# Patient Record
Sex: Male | Born: 1980 | Race: Black or African American | Hispanic: No | Smoking: Never smoker
Health system: Southern US, Community
[De-identification: ages and names within clinical notes are randomized; demographics above are authoritative.]

## PROBLEM LIST (undated history)

## (undated) DIAGNOSIS — N289 Disorder of kidney and ureter, unspecified: Secondary | ICD-10-CM

## (undated) DIAGNOSIS — I10 Essential (primary) hypertension: Secondary | ICD-10-CM

---

## 2021-05-09 ENCOUNTER — Other Ambulatory Visit: Payer: Self-pay

## 2021-05-09 ENCOUNTER — Encounter: Payer: Self-pay | Admitting: Licensed Clinical Social Worker

## 2021-05-09 ENCOUNTER — Ambulatory Visit
Admission: EM | Admit: 2021-05-09 | Discharge: 2021-05-09 | Disposition: A | Discharge status: Home / Self Care | Payer: No Typology Code available for payment source

## 2021-05-09 DIAGNOSIS — J069 Acute upper respiratory infection, unspecified: Secondary | ICD-10-CM | POA: Diagnosis not present

## 2021-05-09 HISTORY — DX: Essential (primary) hypertension: I10

## 2021-05-09 HISTORY — DX: Disorder of kidney and ureter, unspecified: N28.9

## 2021-05-09 MED ORDER — PROMETHAZINE-DM 6.25-15 MG/5ML PO SYRP: 5.0000 mL | ORAL_SOLUTION | Freq: Four times a day (QID) | ORAL | 0 refills | Status: DC | PRN

## 2021-05-09 MED ORDER — AMOXICILLIN-POT CLAVULANATE 875-125 MG PO TABS
1.0000 | ORAL_TABLET | Freq: Two times a day (BID) | ORAL | 0 refills | Status: AC
Start: 2021-05-09 — End: 2021-05-19

## 2021-05-09 MED ORDER — BENZONATATE 100 MG PO CAPS: 200.0000 mg | ORAL_CAPSULE | Freq: Three times a day (TID) | ORAL | 0 refills | Status: DC

## 2021-05-09 MED ORDER — IPRATROPIUM BROMIDE 0.06 % NA SOLN: 2.0000 | Freq: Four times a day (QID) | NASAL | 12 refills | Status: DC

## 2021-05-09 NOTE — ED Triage Notes (Signed)
Pt c/o sore throat, thick green mucus when coughing and blowing nose.

## 2021-05-09 NOTE — Discharge Instructions (Signed)
Take the Augmentin twice daily for 10 days for treatment of your URI.  Use the Atrovent nasal spray, 2 squirts in each nostril every 6 hours, as needed for runny nose and postnasal drip.  Use the Tessalon Perles every 8 hours during the day.  Take them with a small sip of water.  They may give you some numbness to the base of your tongue or a metallic taste in your mouth, this is normal.  Use OTC Tylenol and Ibuprofen as needed for fever or pain.   Use the Promethazine DM cough syrup at bedtime for cough and congestion.  It will make you drowsy so do not take it during the day.  Return for reevaluation or see your primary care provider for any new or worsening symptoms.

## 2021-05-09 NOTE — ED Provider Notes (Signed)
[facility]    CSN: [id number] Arrival date & time: 05/09/21  1409      History   Chief Complaint No chief complaint on file.   HPI Aaron Mcdowell is a 41 y.o. male.   HPI  41 year old male here for evaluation of respiratory complaints.  Patient reports that for the last week or more he has been experiencing ear fullness, nasal congestion with green nasal discharge, a severe sore throat, productive cough for green sputum, and fatigue.  He denies any fever, shortness breath or wheezing, or GI complaints.  His son and daughter have both had similar symptoms.  Past Medical History:  Diagnosis Date   Hypertension    Renal disorder     There are no problems to display for this patient.   History reviewed. No pertinent surgical history.     Home Medications    Prior to Admission medications   Medication Sig Start Date End Date Taking? Authorizing Provider  amoxicillin-clavulanate (AUGMENTIN) 875-125 MG tablet Take 1 tablet by mouth every 12 (twelve) hours for 10 days. 05/09/21 05/19/21 Yes Becky Augusta, NP  benzonatate (TESSALON) 100 MG capsule Take 2 capsules (200 mg total) by mouth every 8 (eight) hours. 05/09/21  Yes Becky Augusta, NP  hydrochlorothiazide (HYDRODIURIL) 25 MG tablet hydrochlorothiazide 25 mg tablet  TAKE 1 TABLET BY MOUTH EVERY DAY 04/13/21  Yes [provider]  ipratropium (ATROVENT) 0.06 % nasal spray Place 2 sprays into both nostrils 4 (four) times daily. 05/09/21  Yes Becky Augusta, NP  promethazine-dextromethorphan (PROMETHAZINE-DM) 6.25-15 MG/5ML syrup Take 5 mLs by mouth 4 (four) times daily as needed. 05/09/21  Yes Becky Augusta, NP  amLODipine (NORVASC) 5 MG tablet Take 5 mg by mouth daily. 04/13/21   [provider]  losartan (COZAAR) 50 MG tablet losartan 50 mg tablet    [provider]    Family History History reviewed. No pertinent family history.  Social History Social History   Tobacco Use   Smoking  status: Never   Smokeless tobacco: Never  Substance Use Topics   Alcohol use: Never   Drug use: Never     Allergies   Patient has no known allergies.   Review of Systems Review of Systems  Constitutional:  Positive for fatigue. Negative for activity change, appetite change and fever.  HENT:  Positive for congestion, ear pain, rhinorrhea, sinus pressure and sore throat.   Respiratory:  Positive for cough. Negative for shortness of breath and wheezing.   Gastrointestinal:  Negative for diarrhea, nausea and vomiting.  Skin:  Negative for rash.  Hematological: Negative.   Psychiatric/Behavioral: Negative.      Physical Exam Triage Vital Signs ED Triage Vitals  Enc Vitals Group     BP 05/09/21 1442 (!) 147/98     Pulse Rate 05/09/21 1442 89     Resp 05/09/21 1442 16     Temp 05/09/21 1442 98.9 F (37.2 C)     Temp Source 05/09/21 1442 Oral     SpO2 05/09/21 1442 98 %     Weight 05/09/21 1438 260 lb (117.9 kg)     Height 05/09/21 1438 5\' 6"  (1.676 m)     Head Circumference --      Peak Flow --      Pain Score 05/09/21 1438 8     Pain Loc --      Pain Edu? --      Excl. in GC? --    No data found.  Updated  Vital Signs BP (!) 147/98 (BP Location: Left Arm)    Pulse 89    Temp 98.9 F (37.2 C) (Oral)    Resp 16    Ht 5\' 6"  (1.676 m)    Wt 260 lb (117.9 kg)    SpO2 98%    BMI 41.97 kg/m   Visual Acuity Right Eye Distance:   Left Eye Distance:   Bilateral Distance:    Right Eye Near:   Left Eye Near:    Bilateral Near:     Physical Exam Vitals and nursing note reviewed.  Constitutional:      General: He is not in acute distress.    Appearance: Normal appearance. He is ill-appearing.  HENT:     Head: Normocephalic and atraumatic.     Right Ear: Tympanic membrane, ear canal and external ear normal. There is no impacted cerumen.     Left Ear: Tympanic membrane, ear canal and external ear normal. There is no impacted cerumen.     Nose: Congestion and rhinorrhea  present.     Mouth/Throat:     Mouth: Mucous membranes are moist.     Pharynx: Oropharynx is clear. Posterior oropharyngeal erythema present.  Cardiovascular:     Rate and Rhythm: Normal rate and regular rhythm.     Pulses: Normal pulses.     Heart sounds: Normal heart sounds. No murmur heard.   No friction rub. No gallop.  Pulmonary:     Effort: Pulmonary effort is normal.     Breath sounds: Normal breath sounds. No wheezing, rhonchi or rales.  Musculoskeletal:     Cervical back: Normal range of motion and neck supple.  Lymphadenopathy:     Cervical: No cervical adenopathy.  Skin:    General: Skin is warm and dry.     Capillary Refill: Capillary refill takes less than 2 seconds.     Findings: No erythema or rash.  Neurological:     General: No focal deficit present.     Mental Status: He is alert and oriented to person, place, and time.  Psychiatric:        Mood and Affect: Mood normal.        Behavior: Behavior normal.        Thought Content: Thought content normal.        Judgment: Judgment normal.     UC Treatments / Results  Labs (all labs ordered are listed, but only abnormal results are displayed) Labs Reviewed - No data to display  EKG   Radiology No results found.  Procedures Procedures (including critical care time)  Medications Ordered in UC Medications - No data to display  Initial Impression / Assessment and Plan / UC Course  I have reviewed the triage vital signs and the nursing notes.  Pertinent labs & imaging results that were available during my care of the patient were reviewed by me and considered in my medical decision making (see chart for details).  Patient is a very pleasant though ill-appearing 41 year old male here for evaluation of respiratory symptoms as outlined HPI above.  Patient's physical exam reveals protegrin tympanic membranes bilaterally with normal reflex and clear external auditory canals.  Nasal mucosa is erythematous and  edematous with purulent discharge in both nares.  Maxillary and frontal sinuses are nontender to percussion.  Oropharyngeal exam reveals posterior oropharyngeal erythema with yellow postnasal drip.  No cervical lymphadenopathy appreciated exam.  Cardiopulmonary exam reveals clear lung sounds in all fields.  Patient Damas consistent with an  upper respiratory infection.  Due to the protracted duration of symptoms I will do a trial of antibiotics with Augmentin twice daily for 10 days.  We will also give Atrovent nasal spray to up the nasal congestion, Tessalon Perles and Promethazine DM cough syrup to help with cough and congestion.  Tylenol and ibuprofen as needed for fever and body aches.  Return precautions reviewed with patient.   Final Clinical Impressions(s) / UC Diagnoses   Final diagnoses:  Acute upper respiratory infection     Discharge Instructions      Take the Augmentin twice daily for 10 days for treatment of your URI.  Use the Atrovent nasal spray, 2 squirts in each nostril every 6 hours, as needed for runny nose and postnasal drip.  Use the Tessalon Perles every 8 hours during the day.  Take them with a small sip of water.  They may give you some numbness to the base of your tongue or a metallic taste in your mouth, this is normal.  Use OTC Tylenol and Ibuprofen as needed for fever or pain.   Use the Promethazine DM cough syrup at bedtime for cough and congestion.  It will make you drowsy so do not take it during the day.  Return for reevaluation or see your primary care provider for any new or worsening symptoms.      ED Prescriptions     Medication Sig Dispense Auth. Provider   amoxicillin-clavulanate (AUGMENTIN) 875-125 MG tablet Take 1 tablet by mouth every 12 (twelve) hours for 10 days. 20 tablet Becky Augusta, NP   ipratropium (ATROVENT) 0.06 % nasal spray Place 2 sprays into both nostrils 4 (four) times daily. 15 mL Becky Augusta, NP   benzonatate (TESSALON) 100 MG  capsule Take 2 capsules (200 mg total) by mouth every 8 (eight) hours. 21 capsule Becky Augusta, NP   promethazine-dextromethorphan (PROMETHAZINE-DM) 6.25-15 MG/5ML syrup Take 5 mLs by mouth 4 (four) times daily as needed. 118 mL Becky Augusta, NP      PDMP not reviewed this encounter.   Becky Augusta, NP 05/09/21 1536

## 2021-07-02 ENCOUNTER — Ambulatory Visit
Admission: RE | Admit: 2021-07-02 | Discharge: 2021-07-02 | Disposition: A | Discharge status: Home / Self Care | Payer: No Typology Code available for payment source | Source: Ambulatory Visit | Attending: General Practice | Admitting: General Practice

## 2021-07-02 ENCOUNTER — Other Ambulatory Visit: Payer: Self-pay | Admitting: General Practice

## 2021-07-02 DIAGNOSIS — M545 Low back pain, unspecified: Secondary | ICD-10-CM | POA: Diagnosis present

## 2021-07-02 DIAGNOSIS — M62830 Muscle spasm of back: Secondary | ICD-10-CM | POA: Diagnosis present

## 2021-07-02 DIAGNOSIS — S3992XA Unspecified injury of lower back, initial encounter: Secondary | ICD-10-CM | POA: Insufficient documentation

## 2021-07-02 DIAGNOSIS — M9903 Segmental and somatic dysfunction of lumbar region: Secondary | ICD-10-CM | POA: Diagnosis present

## 2021-07-02 DIAGNOSIS — X501XXA Overexertion from prolonged static or awkward postures, initial encounter: Secondary | ICD-10-CM | POA: Diagnosis not present

## 2021-07-02 DIAGNOSIS — M47817 Spondylosis without myelopathy or radiculopathy, lumbosacral region: Secondary | ICD-10-CM | POA: Insufficient documentation

## 2021-11-27 ENCOUNTER — Encounter: Payer: Self-pay | Admitting: Urology

## 2021-11-27 ENCOUNTER — Ambulatory Visit (INDEPENDENT_AMBULATORY_CARE_PROVIDER_SITE_OTHER): Payer: No Typology Code available for payment source | Admitting: Urology

## 2021-11-27 ENCOUNTER — Other Ambulatory Visit: Admission: RE | Admit: 2021-11-27 | Payer: No Typology Code available for payment source | Source: Ambulatory Visit

## 2021-11-27 VITALS — BP 125/84 | HR 66 | Ht 71.0 in

## 2021-11-27 DIAGNOSIS — Z3009 Encounter for other general counseling and advice on contraception: Secondary | ICD-10-CM

## 2021-11-27 NOTE — Progress Notes (Signed)
   11/27/21 3:24 PM   Aaron Mcdowell May 02, 1981 [id number]  CC: Discuss vasectomy  HPI: 41 year old male with polycystic kidney disease and CKD who presents to discuss vasectomy.  He has some mild urinary frequency and urgency with medications, but otherwise denies urinary symptoms.  Denies any family history of prostate cancer.  He has children ages 39 and twins that are 39.  He and his partner do not desire any further biologic pregnancies   PMH: Past Medical History:  Diagnosis Date   Hypertension    Renal disorder     Social History:  reports that he has never smoked. He has never used smokeless tobacco. He reports that he does not drink alcohol and does not use drugs.  Physical Exam: BP 125/84 (BP Location: Left Arm, Patient Position: Sitting, Cuff Size: Large)   Pulse 66   Ht 5\' 11"  (1.803 m)   BMI 36.26 kg/m    Constitutional:  Alert and oriented, No acute distress. Cardiovascular: No clubbing, cyanosis, or edema. Respiratory: Normal respiratory effort, no increased work of breathing. GI: Abdomen is soft, nontender, nondistended, no abdominal masses GU: Circumcised phallus with patent meatus, testicles 20 cc and descended bilaterally without masses,  Assessment & Plan:   41 year old male with polycystic kidney disease and CKD interested in vasectomy for permanent sterilization.  We discussed the risks and benefits of vasectomy at length.  Vasectomy is intended to be a permanent form of contraception, and does not produce immediate sterility.  Following vasectomy another form of contraception is required until vas occlusion is confirmed by a post-vasectomy semen analysis obtained 2-3 months after the procedure.  Even after vas occlusion is confirmed, vasectomy is not 100% reliable in preventing pregnancy, and the failure rate is approximately 05/1998.  Repeat vasectomy is required in less than 1% of patients.  He should refrain from ejaculation for 1 week after vasectomy.   Options for fertility after vasectomy include vasectomy reversal, and sperm retrieval with in vitro fertilization or ICSI.  These options are not always successful and may be expensive.  Finally, there are other permanent and non-permanent alternatives to vasectomy available. There is no risk of erectile dysfunction, and the volume of semen will be similar to prior, as the majority of the ejaculate is from the prostate and seminal vesicles.   The procedure takes ~20 minutes.  We recommend patients take 5-10 mg of Valium 30 minutes prior, and he will need a driver post-procedure.  Local anesthetic is injected into the scrotal skin and a small segment of the vas deferens is removed, and the ends occluded. The complication rate is approximately 1-2%, and includes bleeding, infection, and development of chronic scrotal pain.  PLAN: Schedule vasectomy Defers Valium   06/1998, MD 11/27/2021  [facility] [address], [address] [place], [place] [phone]

## 2021-11-27 NOTE — Patient Instructions (Signed)

## 2022-01-01 ENCOUNTER — Encounter: Payer: No Typology Code available for payment source | Admitting: Urology

## 2022-01-01 ENCOUNTER — Encounter: Payer: Self-pay | Admitting: Urology

## 2022-01-01 ENCOUNTER — Ambulatory Visit (INDEPENDENT_AMBULATORY_CARE_PROVIDER_SITE_OTHER): Payer: No Typology Code available for payment source | Admitting: Urology

## 2022-01-01 VITALS — BP 126/72 | HR 71 | Ht 71.0 in | Wt 225.0 lb

## 2022-01-01 DIAGNOSIS — Z9852 Vasectomy status: Secondary | ICD-10-CM

## 2022-01-01 DIAGNOSIS — Z302 Encounter for sterilization: Secondary | ICD-10-CM

## 2022-01-01 NOTE — Progress Notes (Signed)
VASECTOMY PROCEDURE NOTE:  The patient was taken to the minor procedure room and placed in the supine position. His genitals were prepped and draped in the usual sterile fashion. The right vas deferens was brought up to the skin of the right upper scrotum. The skin overlying it was anesthetized with 1% lidocaine without epinephrine, anesthetic was also injected alongside the vas deferens in the direction of the inguinal canal. The no scalpel vasectomy instrument was used to make a small perforation in the scrotal skin. The vasectomy clamp was used to grasp the vas deferens. It was carefully dissected free from surrounding structures. A 1cm segment of the vas was removed, and the cut ends of the mucosa were cauterized.  No significant bleeding was noted. The vas deferens was returned to the scrotum. The skin incision was closed with a simple interrupted stitch of 4-0 chromic.  Attention was then turned to the left side. The left vasectomy was performed in the same exact fashion. Sterile dressings were placed over each incision. The patient tolerated the procedure well.  IMPRESSION/DIAGNOSIS: The patient is a 41 year old gentleman who underwent a vasectomy today. Post-procedure instructions were reviewed. I stressed the importance of continuing to use birth control until he provides a semen specimen more than 2 months from now that demonstrates azoospermia.  We discussed return precautions including fever over 101, significant bleeding or hematoma, or uncontrolled pain. I also stressed the importance of avoiding strenuous activity for one week, no sexual activity or ejaculations for 5 days, intermittent icing over the next 48 hours, and scrotal support.   PLAN: The patient will be advised of his semen analysis results when available.  Legrand Rams, MD 01/01/2022

## 2022-01-01 NOTE — Patient Instructions (Signed)

## 2022-04-04 ENCOUNTER — Other Ambulatory Visit: Payer: No Typology Code available for payment source

## 2022-04-05 ENCOUNTER — Other Ambulatory Visit: Payer: No Typology Code available for payment source

## 2022-04-08 ENCOUNTER — Other Ambulatory Visit: Payer: No Typology Code available for payment source

## 2022-04-08 DIAGNOSIS — Z9852 Vasectomy status: Secondary | ICD-10-CM

## 2022-04-10 ENCOUNTER — Telehealth: Payer: Self-pay

## 2022-04-10 LAB — POST-VAS SPERM EVALUATION,QUAL: Volume: 2.7 mL

## 2022-04-10 NOTE — Telephone Encounter (Signed)
Called pt informed him that he needs a repeat semen analysis as sperm are still present per his lab results. Patient states that he declines repeat semen analysis and would be "happy if God gave me a son". The patient states that he and his wife have been married for 82yrs and never conceived naturally. He states that he and his wife only conceived via IVF. Strongly advised pt that the only way to ensure his vasectomy was effective is to have a negative semen analysis. Pt voiced understanding and states that he does not wish to proceed.

## 2022-04-10 NOTE — Telephone Encounter (Signed)
-----   Message from Sondra Come, MD sent at 04/10/2022  8:18 AM EST ----- Sperm still present, encourage ejaculations to clear residual sperm, repeat more sensitive semen analysis in 6 weeks  Legrand Rams, MD 04/10/2022

## 2023-09-04 IMAGING — CR DG LUMBAR SPINE COMPLETE 4+V
1 series · 5 of 5 positions shown · non-contrast
Comparison: None.

CLINICAL DATA: Somatic dysfunction of lumbar region.

Low back pain, unspecified. Back muscle spasm. Patient reports
injury few weeks ago picking up daughter.
EXAM:
LUMBAR SPINE - COMPLETE 4+ VIEW

[Series 1: dg lumbar spine complete 4 +v · 0.14mm/px · 5 of 5 slices shown]
[im 1/5]
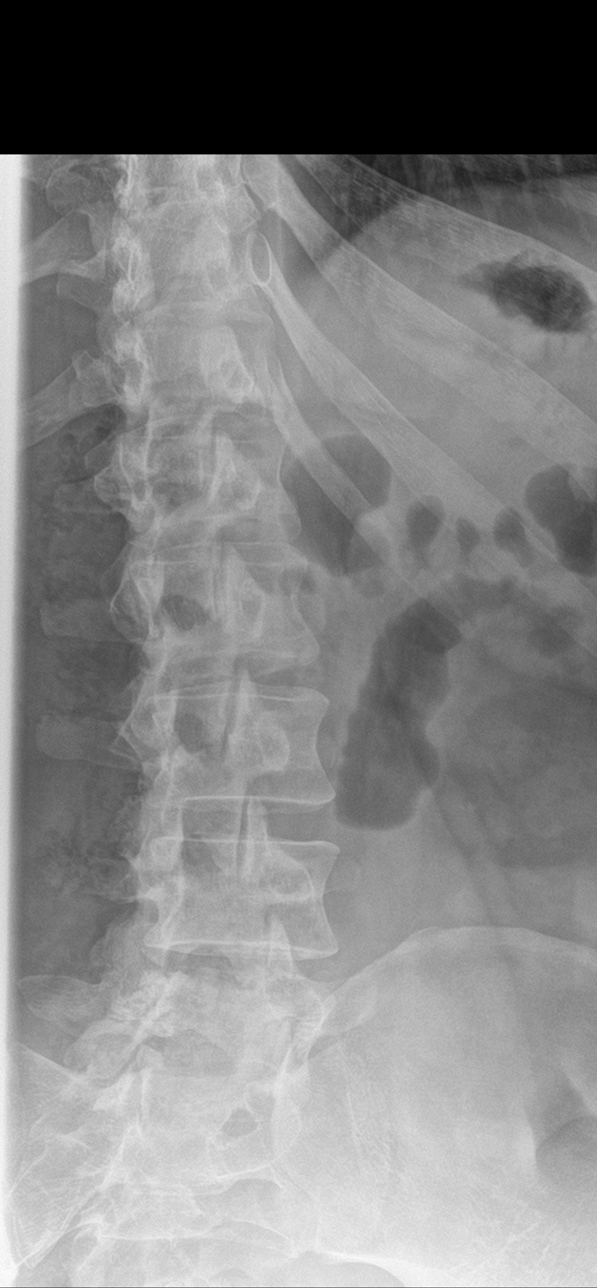
[im 2/5]
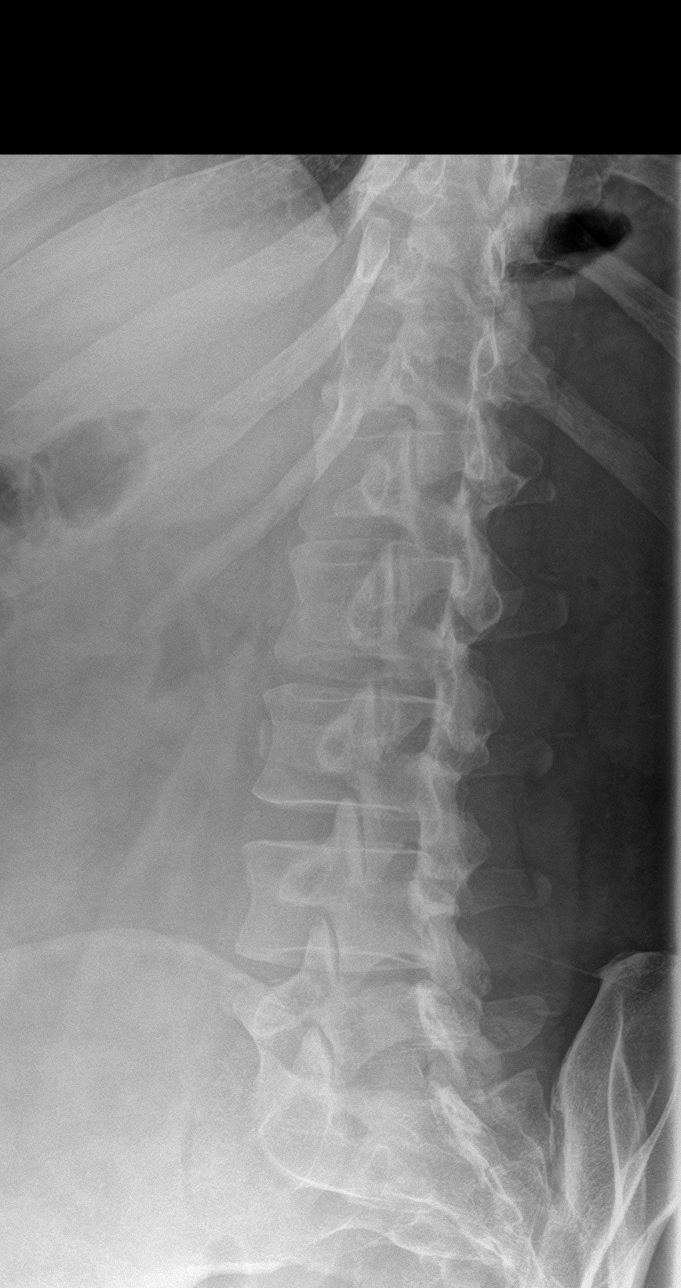
[im 3/5]
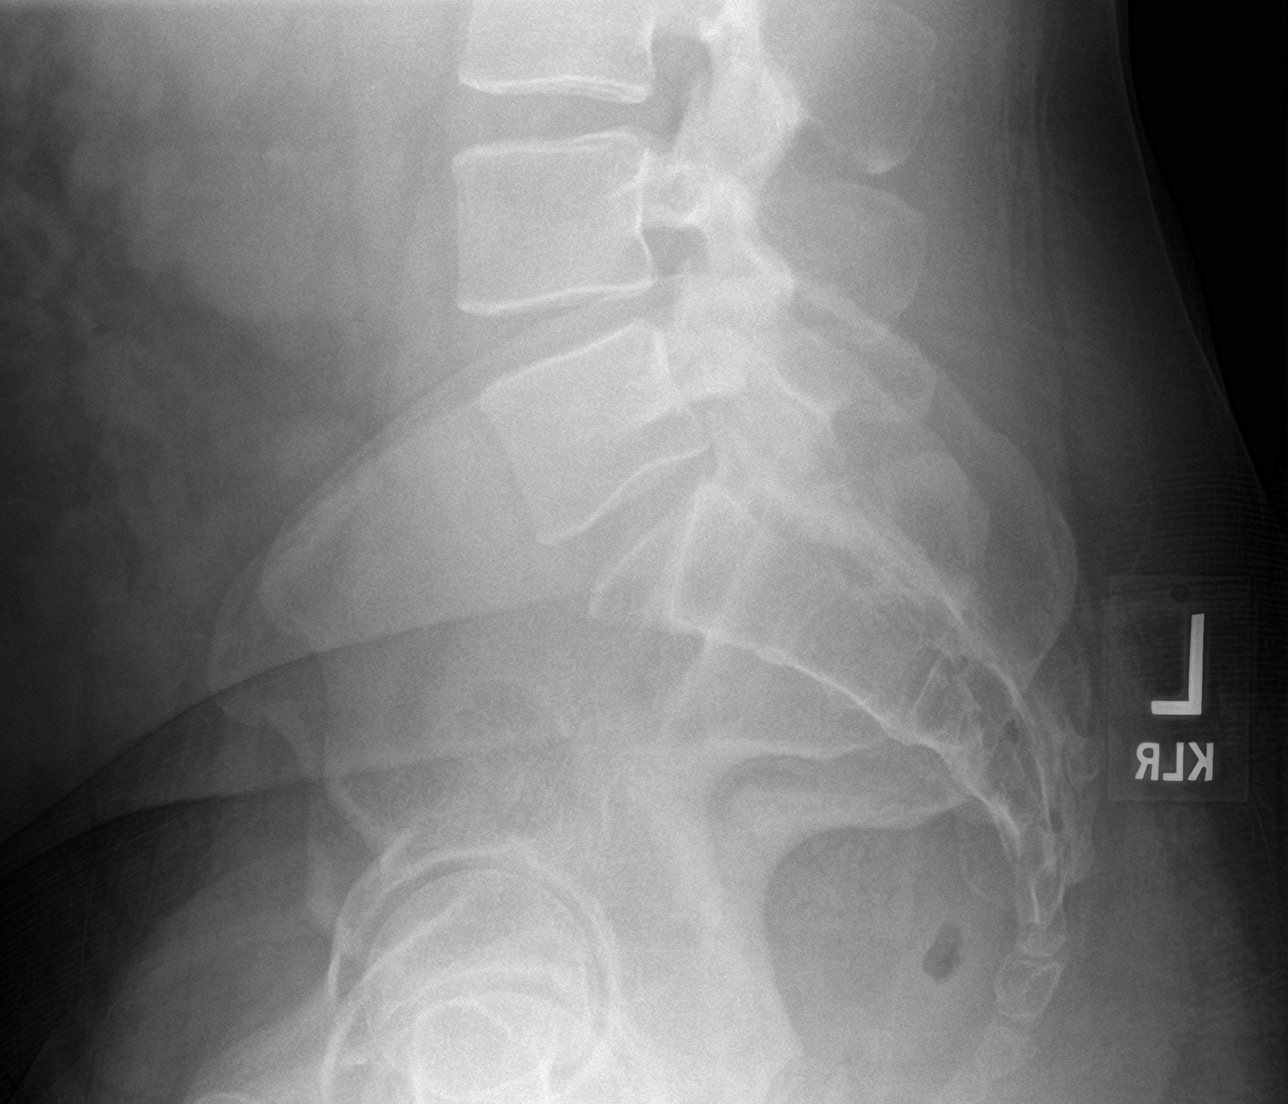
[im 4/5]
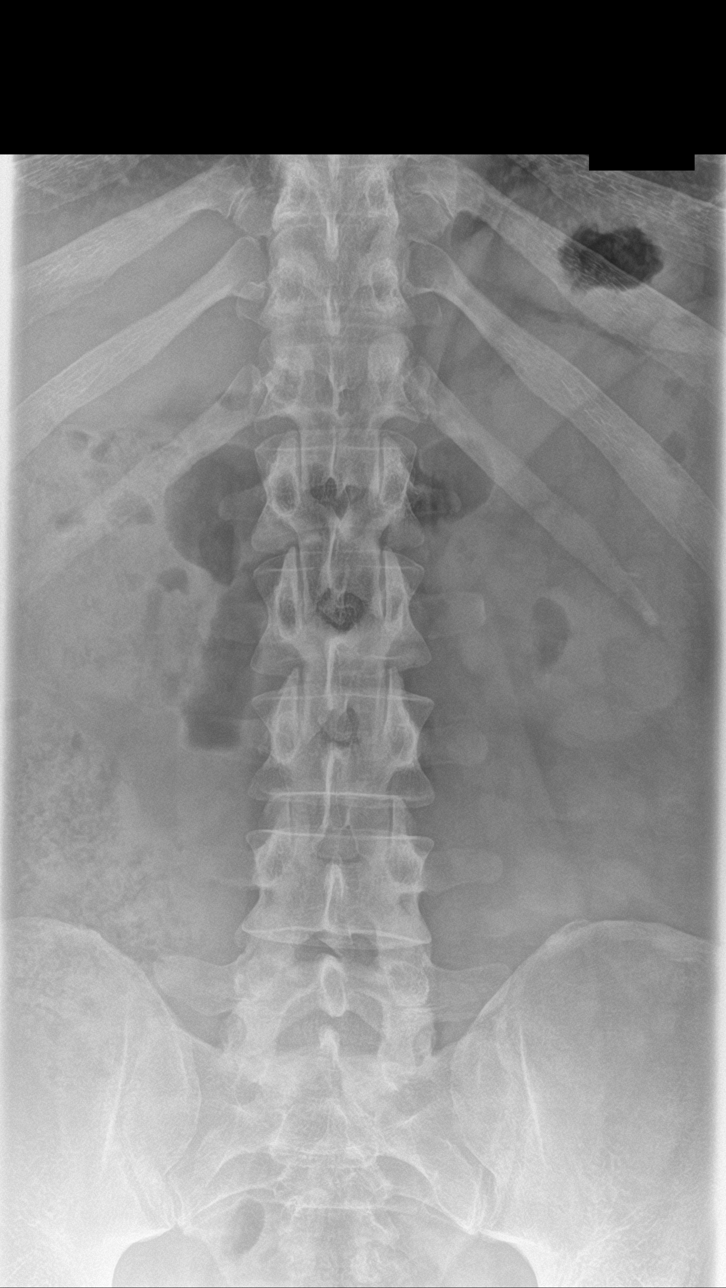
[im 5/5]
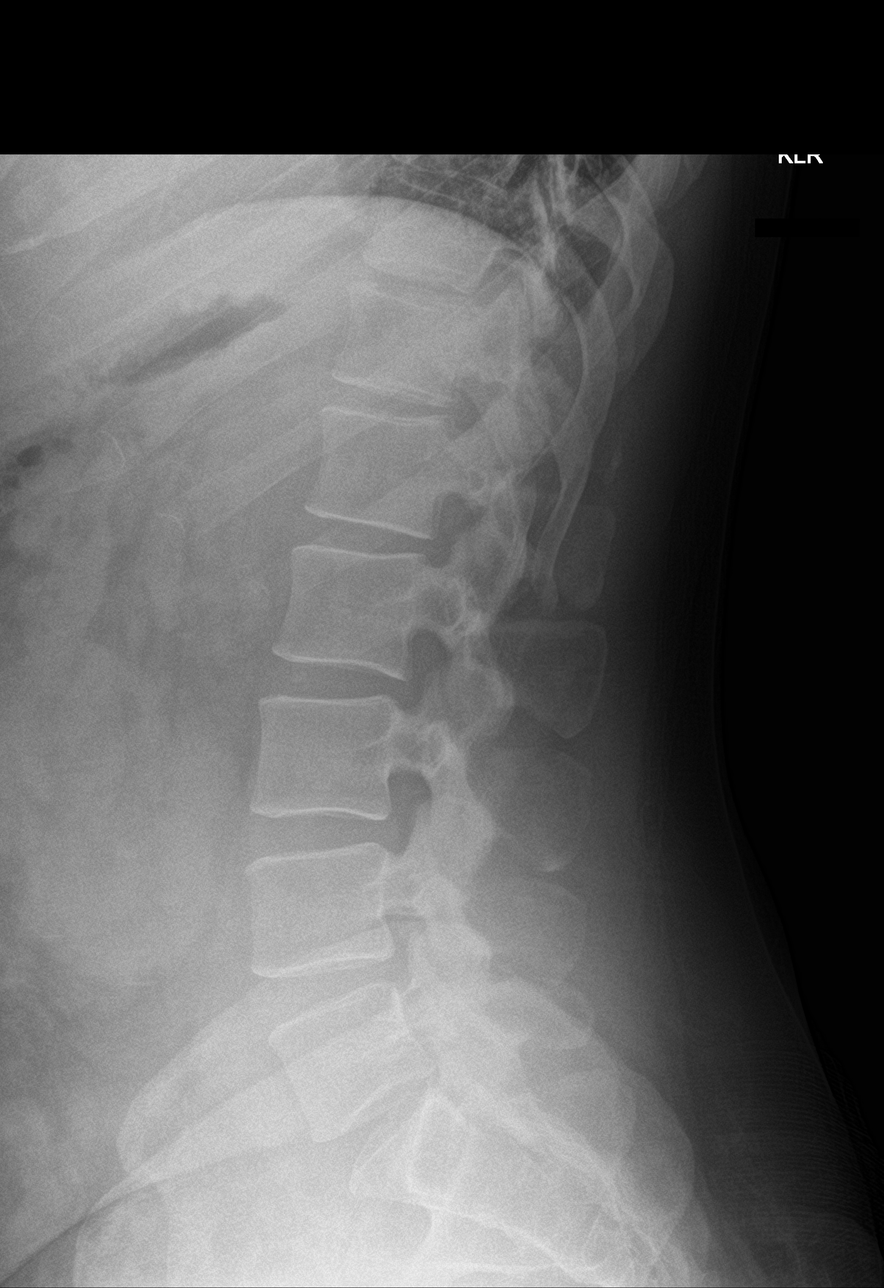

[5 of 5 positions shown; findings below may reference images not displayed]

FINDINGS: There are 5 non-rib-bearing lumbar vertebra. Normal alignment. No
listhesis. Normal vertebral body heights. Normal intervertebral disc
spaces. Slight L5-S1 facet hypertrophy. No evidence of fracture,
focal bone abnormality, or pars defects. The sacroiliac joints are
normal
IMPRESSION: Slight L5-S1 facet hypertrophy.
